# Patient Record
Sex: Female | Born: 1980 | Race: Black or African American | Hispanic: No | Marital: Married | Smoking: Never smoker
Health system: Southern US, Community
[De-identification: ages and names within clinical notes are randomized; demographics above are authoritative.]

---

## 2011-01-06 ENCOUNTER — Emergency Department (HOSPITAL_COMMUNITY)
Admission: EM | Admit: 2011-01-06 | Discharge: 2011-01-06 | Disposition: A | Discharge status: Home / Self Care | Payer: Self-pay | Attending: Emergency Medicine | Admitting: Emergency Medicine

## 2011-01-06 ENCOUNTER — Emergency Department (HOSPITAL_COMMUNITY): Payer: Self-pay

## 2011-01-06 DIAGNOSIS — S025XXA Fracture of tooth (traumatic), initial encounter for closed fracture: Secondary | ICD-10-CM | POA: Insufficient documentation

## 2011-01-06 DIAGNOSIS — R51 Headache: Secondary | ICD-10-CM | POA: Insufficient documentation

## 2014-10-04 ENCOUNTER — Encounter (HOSPITAL_COMMUNITY): Payer: Self-pay | Admitting: Nurse Practitioner

## 2014-10-04 ENCOUNTER — Emergency Department (HOSPITAL_COMMUNITY): Payer: PRIVATE HEALTH INSURANCE

## 2014-10-04 ENCOUNTER — Emergency Department (HOSPITAL_COMMUNITY)
Admission: EM | Admit: 2014-10-04 | Discharge: 2014-10-04 | Disposition: A | Discharge status: Home / Self Care | Payer: PRIVATE HEALTH INSURANCE | Attending: Emergency Medicine | Admitting: Emergency Medicine

## 2014-10-04 DIAGNOSIS — Z87828 Personal history of other (healed) physical injury and trauma: Secondary | ICD-10-CM | POA: Diagnosis not present

## 2014-10-04 DIAGNOSIS — D492 Neoplasm of unspecified behavior of bone, soft tissue, and skin: Secondary | ICD-10-CM | POA: Insufficient documentation

## 2014-10-04 DIAGNOSIS — Z86718 Personal history of other venous thrombosis and embolism: Secondary | ICD-10-CM | POA: Diagnosis not present

## 2014-10-04 DIAGNOSIS — M25561 Pain in right knee: Secondary | ICD-10-CM | POA: Diagnosis present

## 2014-10-04 DIAGNOSIS — Z86711 Personal history of pulmonary embolism: Secondary | ICD-10-CM | POA: Insufficient documentation

## 2014-10-04 MED ORDER — IBUPROFEN 800 MG PO TABS: 800.0000 mg | ORAL_TABLET | Freq: Three times a day (TID) | ORAL | Status: DC

## 2014-10-04 NOTE — ED Provider Notes (Signed)
CSN: [id number]     Arrival date & time 10/04/14  1205 History  This chart was scribed for non-physician practitioner, Brent General, PA-C working with Leonard Schwartz, MD by Tula Nakayama, ED scribe. This patient was seen in room TR06C/TR06C and the patient's care was started at 1:04 PM   Chief Complaint  Patient presents with  . Knee Pain   The history is provided by the patient. No language interpreter was used.    HPI Comments: Julie Kelley is a 34 y.o. female who presents to the Emergency Department complaining of constant, moderate right knee pain that started 2 weeks ago. She reports a knot on the inferior aspect of her knee as an associated symptom. Pt's pain becomes worse with palpation and bearing weight, but does not increase with movement. She has tried Ibuprofen with no relief. Pt reports that she was previously evaluated by [facility] ED for the same last week who did not provide an explanation for her pain, but referred her to an orthopedist. She has a follow-up with the orthopedist in 4 days. Pt denies recent long travel, a history of PE/DVT and recent injuries or falls. She also denies numbness and weakness as an associated symptom.   History reviewed. No pertinent past medical history. History reviewed. No pertinent past surgical history. History reviewed. No pertinent family history. History  Substance Use Topics  . Smoking status: Never Smoker   . Smokeless tobacco: Not on file  . Alcohol Use: No   OB History    No data available     Review of Systems  Musculoskeletal: Positive for arthralgias.  Skin: Negative for wound.  Neurological: Negative for weakness and numbness.    Allergies  Review of patient's allergies indicates no known allergies.  Home Medications   Prior to Admission medications   Medication Sig Start Date End Date Taking? Authorizing Provider  ibuprofen (ADVIL,MOTRIN) 800 MG tablet Take 1 tablet (800 mg total) by mouth 3 (three) times daily.  10/04/14   Dahlia Bailiff, PA-C   BP 100/70 mmHg  Pulse 74  Temp(Src) 97.8 F (36.6 C) (Oral)  Resp 16  SpO2 100%  LMP 09/19/2014 Physical Exam  Constitutional: She appears well-developed and well-nourished. No distress.  HENT:  Head: Normocephalic and atraumatic.  Eyes: Conjunctivae and EOM are normal.  Neck: Neck supple. No tracheal deviation present.  Cardiovascular: Normal rate.   Pulmonary/Chest: Effort normal. No respiratory distress.  Musculoskeletal: Normal range of motion.  3 x 3 cm raised area on the lateral joint line of her knee; non-erythematous; no obvious effusion; full active and passive ROM; no anterior or posterior, medial or lateral instability; DP pulse 2+  Skin: Skin is warm and dry.  Psychiatric: She has a normal mood and affect. Her behavior is normal.  Nursing note and vitals reviewed.   ED Course  Procedures   DIAGNOSTIC STUDIES: Oxygen Saturation is 99% on RA, normal by my interpretation.    COORDINATION OF CARE: 1:08 PM Discussed x-ray results with pt. Discussed treatment plan with pt which includes US of the affected area at bedside, knee sleeve and pain management. Pt agreed to plan.  1:46 PM US showed minimal fluid. Recommended pt to follow-up with orthopedist. Advised pt to reurn with onset of fever or increased warmth. Pt agreed to plan.  Labs Review Labs Reviewed - No data to display  Imaging Review Dg Knee Complete 4 Views Right  10/04/2014   CLINICAL DATA:  Right anterior lateral knee pain and focal  swelling, increased over the past week. No reported injury.  EXAM: RIGHT KNEE - COMPLETE 4+ VIEW  COMPARISON:  10/03/2014.  FINDINGS: Mild medial and patellofemoral spur formation. No fracture, effusion or visible mass.  IMPRESSION: Mild degenerative changes.   Electronically Signed   By: Claudie Revering M.D.   On: 10/04/2014 12:37     EKG Interpretation None      MDM   Final diagnoses:  Soft tissue tumor    Patient here with atypical,  superficial soft tissue lesion. This lesion is well-appearing, mildly tender to palpation. It does not have any signs of infection. Patient's radiographs are unremarkable for acute pathology. Lesion was assessed with ultrasound, findings were nonspecific. No concern for DVT on exam or by patient's history. Wells criteria 0 for DVT. Patient afebrile, well-appearing, hemodynamically stable and in no acute distress. No concern for gout or septic arthritis. Patient stable for discharge, strongly instructed to follow up with orthopedics, patient states she has appointment with Ortho in approximately 5 days. Return precautions discussed, patient verbalizes understanding and agreement of this plan.  I personally performed the services described in this documentation, which was scribed in my presence. The recorded information has been reviewed and is accurate.  BP 100/70 mmHg  Pulse 74  Temp(Src) 97.8 F (36.6 C) (Oral)  Resp 16  SpO2 100%  LMP 09/19/2014  Signed,  Dahlia Bailiff, PA-C 5:26 PM    Dahlia Bailiff, PA-C 10/04/14 1726  Leonard Schwartz, MD 10/05/14 [phone]

## 2014-10-04 NOTE — ED Notes (Signed)
She reports R knee pain x 2 weeks. Denies any injuries. She had begun to feel a "knot" in the the knee for past few days and the pain is increasing. She took ibuprofen with no relief. Ambulatory, cms intact.

## 2014-10-04 NOTE — Discharge Instructions (Signed)
Lipoma A lipoma is a noncancerous (benign) tumor composed of fat cells. They are usually found under the skin (subcutaneous). A lipoma may occur in any tissue of the body that contains fat. Common areas for lipomas to appear include the back, shoulders, buttocks, and thighs. Lipomas are a very common soft tissue growth. They are soft and grow slowly. Most problems caused by a lipoma depend on where it is growing. DIAGNOSIS  A lipoma can be diagnosed with a physical exam. These tumors rarely become cancerous, but radiographic studies can help determine this for certain. Studies used may include:  Computerized X-ray scans (CT or CAT scan).  Computerized magnetic scans (MRI). TREATMENT  Small lipomas that are not causing problems may be watched. If a lipoma continues to enlarge or causes problems, removal is often the best treatment. Lipomas can also be removed to improve appearance. Surgery is done to remove the fatty cells and the surrounding capsule. Most often, this is done with medicine that numbs the area (local anesthetic). The removed tissue is examined under a microscope to make sure it is not cancerous. Keep all follow-up appointments with your caregiver. SEEK MEDICAL CARE IF:   The lipoma becomes larger or hard.  The lipoma becomes painful, red, or increasingly swollen. These could be signs of infection or a more serious condition. Document Released: 02/13/2002 Document Revised: 05/18/2011 Document Reviewed: 07/26/2009 [facility] Patient Information 2015 [place], [place]. This information is not intended to replace advice given to you by your health care provider. Make sure you discuss any questions you have with your health care provider.   Emergency Department Resource Guide 1) Find a Doctor and Pay Out of Pocket Although you won't have to find out who is covered by your insurance plan, it is a good idea to ask around and get recommendations. You will then need to call the office and see  if the doctor you have chosen will accept you as a new patient and what types of options they offer for patients who are self-pay. Some doctors offer discounts or will set up payment plans for their patients who do not have insurance, but you will need to ask so you aren't surprised when you get to your appointment.  2) Contact Your Local Health Department Not all health departments have doctors that can see patients for sick visits, but many do, so it is worth a call to see if yours does. If you don't know where your local health department is, you can check in your phone book. The CDC also has a tool to help you locate your state's health department, and many state websites also have listings of all of their local health departments.  3) Find a [facility] If your illness is not likely to be very severe or complicated, you may want to try a walk in clinic. These are popping up all over the country in pharmacies, drugstores, and shopping centers. They're usually staffed by nurse practitioners or physician assistants that have been trained to treat common illnesses and complaints. They're usually fairly quick and inexpensive. However, if you have serious medical issues or chronic medical problems, these are probably not your best option.  No Primary Care Doctor: - Call Health Connect at  [phone] - they can help you locate a primary care doctor that  accepts your insurance, provides certain services, etc. - Physician Referral Service- [phone]  Chronic Pain Problems: Organization         Address  Phone   Notes  [facility]  [phone] Patients need to be referred by their primary care doctor.   Medication Assistance: Organization         Address  Phone   Notes  [facility] Medication [facility] [address] [place], [place] [address] [phone] --Must be a resident of [facility] -- Must have NO insurance coverage whatsoever (no  Medicaid/ Medicare, etc.) -- The pt. MUST have a primary care doctor that directs their care regularly and follows them in the community   MedAssist  [phone]   [employer]  [phone]    Agencies that provide inexpensive medical care: Organization         Address  Phone   Notes  Wells  [phone]   [facility]    [phone]   [facility] [place], [place] [address] [phone]   [place] [address], [place] [phone]   Planned Parenthood    [phone]   [facility]    [phone]   [place] and [address], [place] Phone:  [phone], Fax:  [phone] Hours of Operation:  9 am - 6 pm, M-F.  Also accepts Medicaid/Medicare and self-pay.  [facility] for [place] [address], [address], [place] Phone: [phone], Fax: [phone]. Hours of Operation:  8:30 am - 5:30 pm, M-F.  Also accepts Medicaid and self-pay.  [facility] [place] [address] [place] Phone: [phone]   [place], [place], [place] [phone], Ext. [phone] Mondays & Thursdays: 7-9 AM.  First 15 patients are seen on a first come, first serve basis.    [place] Providers:  Organization         Address  Phone   Notes  [facility] [address],  [phone] Also accepts self-pay patients.  [facility] [address], [place]  [phone]   [place], [address], [place] [phone]   [facility] [address] [place] [phone]   Lucianne Lei [address], [place]   [phone] Only accepts [place] Access [place] patients after they have their name applied to their card.    Self-Pay (no insurance) in [facility]:  Organization         Address  Phone   Notes  Sickle Cell Patients, [facility] Internal Medicine [place] [phone]   [facility] Urgent Care [place] [phone]   [facility] [place]  [place], [place], [place] [phone]   [facility]/Dr. Osei-Bonsu  [address], [place] or [address], [place] [phone] Phone number for both [place] and [place] locations is the same.  Urgent Medical and [facility] [address], [place] [phone]   [facility] [address] [place] or [address] [phone]   [place] [facility] [address], [place] [phone], phone; [phone], fax Sees patients 1st and 3rd Saturday of every month.  Must not qualify for public or private insurance (i.e. Medicaid,  Medicare, [place] Health Choice, Veterans' Benefits)  Household income should be no more than 200% of the poverty level The clinic cannot treat you if you are pregnant or think you are pregnant  Sexually transmitted diseases are not treated at the clinic.    Dental Care: Organization         Address  Phone  Notes  [facility] Department of [facility] [place] [phone] Accepts children up to age 91 who are enrolled in [place] or [place]; pregnant women with a Medicaid card; and children who have applied for Medicaid or [place] Health Choice, but were declined, whose parents can pay a reduced fee at time of service.  [facility] Department of [facility]  [address], [place] [phone] Accepts children up to age 23 who are enrolled in [place] or [place]; pregnant women with a Medicaid card; and children who have applied for Medicaid or [place] Health Choice,  but were declined, whose parents can pay a reduced fee at time of service.  [place] Adult Dental Access PROGRAM  [place] [phone] Patients are seen by appointment only. Walk-ins are not accepted. [place] will see patients 44 years of age and older. Monday - Tuesday (8am-5pm) Most Wednesdays (8:30-5pm) $30 per visit, cash only  [facility] Adult Dental Access PROGRAM  [address], [facility] [phone] Patients are seen by appointment only. Walk-ins are not accepted. [place] will see patients 74 years of age and older. One Wednesday Evening (Monthly: Volunteer Based).  $30 per visit, cash only  [place]  [phone] for adults; Children under age 66, call Graduate Pediatric Dentistry at [phone]. Children aged 28-14, please call [phone] to request a pediatric application.  Dental services are provided in all areas of dental care including fillings, crowns and bridges, complete and partial dentures, implants, gum treatment, root canals, and extractions. Preventive care is also provided. Treatment is provided to both adults and children. Patients are selected via a lottery and there is often a waiting list.   [facility] [address], [place]  [phone] [email or web address]   [facility] [address] [place], [place] [phone], Ext. [phone] Second and Fourth Thursday of each month, opens at 6:30 AM; Clinic ends at 9 AM.  Patients are seen on a first-come first-served basis, and a limited number are seen during each clinic.   [facility]  [address] Hillard Danker [place], [place] [phone]   Eligibility Requirements You must have lived in [place], [place], or [place] counties for at least the last three months.   You cannot be eligible for state or federal sponsored [employer], including [employer], [place], or [employer].   You generally  cannot be eligible for healthcare insurance through your employer.    How to apply: Eligibility screenings are held every Tuesday and Wednesday afternoon from 1:00 pm until 4:00 pm. You do not need an appointment for the interview!  [facility] [address] [place], [place]   [place]  [place] Department  [place]  [phone]    Behavioral Health Resources in the Community: Intensive Outpatient Programs Organization         Address  Phone  Notes  [place]  Services [place] [address] [place], [place] [phone]   [facility] Outpatient [address], [place], [place]   ADS: Alcohol & Drug Svcs [address] [place], [place]   [place] [address],  [place], [place] or [phone]   Substance Abuse Resources Organization         Address  Phone  Notes  Alcohol and Drug Services  [phone]   [place]  [phone]   The [place]   Chinita Pester  [phone]   Residential & Outpatient Substance Abuse Program  [phone]   Psychological Services Organization         Address  Phone  Notes  [facility] [place]  [place]  [phone]   [place] [address], [place] or [phone]    Mobile Crisis Teams Organization         Address  Phone  Notes  [facility]atives, Mobile Crisis Care Unit  [phone]   Assertive Psychotherapeutic Services  [address] [place], [place]   [place] [address], [place] [phone]    Self-Help/Support Groups Organization         Address  Phone             Notes  [place]. of [place] - variety of support groups  [place] Call for more  information  Narcotics Anonymous (NA), [facility] [address], [employer] [place]  2 meetings at this location   Special educational needs teacher         Address  Phone  Notes  ASAP Residential Treatment [place],    [place]  [phone]   [facility]  [address] [place] [address]37902, [place], [place]   [place], [place] [phone] Admissions: 8am-3pm M-F  Incentives Substance [place] [address],    [place], [place] [phone]   [facility] [address] [place], [place], [place]   [facility] [address]  [place], [place]   [facility]ight Programs - Intensive Outpatient [address], [place], [place]   [facility] ([place] [address].,  [place], [place] [phone] or [phone]   [facility]idential Treatment Services (RTS) [address], [place], [place] Accepts Medicaid  [facility] [place] [address]  [place] [phone] Substance Abuse/Addiction Treatment   [facility] Organization         Address  Phone  Notes  [facility]  [phone]   Domenic Schwab, PhD [address] Arlis [place], [place]   [phone] or [phone]   [place], [place] [phone]   [facility] [address], [place], [place] [phone] Insurance/Medicaid/sponsorship through [facility] and Families [address] [place]                                    [place], [place] [phone] [place] [address], [place] [phone]    Dr. Adele Schilder  [phone]   [facility] [place] Dept. 1) [address] [place] 2) [place] 3)  [place], [place] [phone]  [phone]   [place] [phone] or [phone] (After Hours)

## 2014-10-19 DIAGNOSIS — M7120 Synovial cyst of popliteal space [Baker], unspecified knee: Secondary | ICD-10-CM | POA: Insufficient documentation

## 2014-10-19 DIAGNOSIS — M67469 Ganglion, unspecified knee: Secondary | ICD-10-CM | POA: Insufficient documentation

## 2014-10-19 DIAGNOSIS — S83289A Other tear of lateral meniscus, current injury, unspecified knee, initial encounter: Secondary | ICD-10-CM | POA: Insufficient documentation

## 2014-10-26 DIAGNOSIS — M25561 Pain in right knee: Secondary | ICD-10-CM | POA: Insufficient documentation

## 2014-11-14 DIAGNOSIS — M9420 Chondromalacia, unspecified site: Secondary | ICD-10-CM | POA: Insufficient documentation

## 2015-04-04 DIAGNOSIS — M722 Plantar fascial fibromatosis: Secondary | ICD-10-CM | POA: Insufficient documentation

## 2015-08-21 ENCOUNTER — Emergency Department (HOSPITAL_COMMUNITY): Payer: PRIVATE HEALTH INSURANCE

## 2015-08-21 ENCOUNTER — Emergency Department (HOSPITAL_COMMUNITY)
Admission: EM | Admit: 2015-08-21 | Discharge: 2015-08-21 | Disposition: A | Discharge status: Home / Self Care | Payer: PRIVATE HEALTH INSURANCE | Attending: Emergency Medicine | Admitting: Emergency Medicine

## 2015-08-21 ENCOUNTER — Encounter (HOSPITAL_COMMUNITY): Payer: Self-pay | Admitting: Emergency Medicine

## 2015-08-21 DIAGNOSIS — Y92321 Football field as the place of occurrence of the external cause: Secondary | ICD-10-CM | POA: Diagnosis not present

## 2015-08-21 DIAGNOSIS — W500XXA Accidental hit or strike by another person, initial encounter: Secondary | ICD-10-CM | POA: Diagnosis not present

## 2015-08-21 DIAGNOSIS — Y9361 Activity, american tackle football: Secondary | ICD-10-CM | POA: Insufficient documentation

## 2015-08-21 DIAGNOSIS — M25572 Pain in left ankle and joints of left foot: Secondary | ICD-10-CM

## 2015-08-21 DIAGNOSIS — Y999 Unspecified external cause status: Secondary | ICD-10-CM | POA: Diagnosis not present

## 2015-08-21 DIAGNOSIS — M79672 Pain in left foot: Secondary | ICD-10-CM | POA: Diagnosis present

## 2015-08-21 MED ORDER — NAPROXEN 500 MG PO TABS: 500.0000 mg | ORAL_TABLET | Freq: Two times a day (BID) | ORAL | Status: DC

## 2015-08-21 NOTE — ED Notes (Signed)
Patient transported to X-ray 

## 2015-08-21 NOTE — ED Provider Notes (Signed)
CSN: [id number]     Arrival date & time 08/21/15  1825 History  By signing my name below, I, Soijett Blue, attest that this documentation has been prepared under the direction and in the presence of Donnald Garre, PA-C Electronically Signed: [place], ED Scribe. 08/21/2015. 8:02 PM.   Chief Complaint  Patient presents with  . Foot Pain      The history is provided by the patient. No language interpreter was used.    Julie Kelley is a 35 y.o. female who presents to the Emergency Department complaining of left foot pain onset yesterday. Pt reports that her 75 year old son fell onto her left foot while he was playing football. Pt notes that she has been able to ambulate since the incident. Pt is having associated symptoms of left foot swelling. She notes that she has tried tylenol with mild relief of her symptoms. She denies color change, wound, rash, gait problem, and any other symptoms.    History reviewed. No pertinent past medical history. History reviewed. No pertinent past surgical history. No family history on file. Social History  Substance Use Topics  . Smoking status: Never Smoker   . Smokeless tobacco: None  . Alcohol Use: No   OB History    No data available     Review of Systems  Musculoskeletal: Positive for joint swelling (left foot) and arthralgias (left foot). Negative for gait problem.  Skin: Negative for color change, rash and wound.  All other systems reviewed and are negative.    Allergies  Review of patient's allergies indicates no known allergies.  Home Medications   Prior to Admission medications   Medication Sig Start Date End Date Taking? Authorizing Provider  ibuprofen (ADVIL,MOTRIN) 800 MG tablet Take 1 tablet (800 mg total) by mouth 3 (three) times daily. 10/04/14   Dahlia Bailiff, PA-C   BP 135/89 mmHg  Pulse 84  Temp(Src) 98.5 F (36.9 C) (Oral)  Resp 18  SpO2 100%  LMP 08/01/2015 (Exact Date) Physical Exam  Constitutional: She is  oriented to person, place, and time. She appears well-developed and well-nourished. No distress.  HENT:  Head: Normocephalic and atraumatic.  Eyes: Conjunctivae are normal. Right eye exhibits no discharge. Left eye exhibits no discharge. No scleral icterus.  Cardiovascular: Normal rate.   Pulmonary/Chest: Effort normal.  Musculoskeletal:       Left ankle: She exhibits no ecchymosis and no deformity. Tenderness. Lateral malleolus tenderness found.       Left foot: There is tenderness. There is normal range of motion, no swelling and no deformity.  Mild TTP over dorsum of left foot and lateral malleolus. Mild pain with dorsi flexion but no decreased ROM. No edema, ecchymosis, or erythema. No obvious bony deformity. Intact distal pulses.   Neurological: She is alert and oriented to person, place, and time. Coordination normal.  Skin: Skin is warm and dry. No rash noted. She is not diaphoretic. No erythema. No pallor.  Psychiatric: She has a normal mood and affect. Her behavior is normal.  Nursing note and vitals reviewed.   ED Course  Procedures (including critical care time) DIAGNOSTIC STUDIES: Oxygen Saturation is 100% on RA, nl by my interpretation.    COORDINATION OF CARE: 8:01 PM Discussed treatment plan with pt at bedside which includes left foot xray and naprosyn Rx and pt agreed to plan.    Labs Review Labs Reviewed - No data to display  Imaging Review Dg Foot Complete Left  08/21/2015  CLINICAL DATA:  Injury to the left foot with pain and swelling on the lateral side near the fifth metatarsal base. EXAM: LEFT FOOT - COMPLETE 3+ VIEW COMPARISON:  04/04/2015 FINDINGS: Negative for a fracture or dislocation. Alignment of the foot is normal. Again noted is a prominent spur along the plantar aspect of the calcaneus. Soft tissues are unremarkable. IMPRESSION: No acute bone abnormality. Calcaneal spur. Electronically Signed   By: Markus Daft M.D.   On: 08/21/2015 19:43   I have  personally reviewed and evaluated these images as part of my medical decision-making.   EKG Interpretation None      MDM   Final diagnoses:  Ankle pain, left   Patient X-Ray negative for obvious fracture or dislocation.  Pt advised to follow up with orthopedics. Patient given ace wrap while in ED and will be discharged home with naprosyn. Conservative therapy recommended and discussed. Patient will be discharged home & is agreeable with above plan. Returns precautions discussed. Pt appears safe for discharge.   I personally performed the services described in this documentation, which was scribed in my presence. The recorded information has been reviewed and is accurate.     Dondra Spry [place], PA-C 08/21/15 2313  Davonna Belling, MD 08/22/15 [phone]

## 2015-08-21 NOTE — Discharge Instructions (Signed)
Ankle Pain Ankle pain is a common symptom. The bones, cartilage, tendons, and muscles of the ankle joint perform a lot of work each day. The ankle joint holds your body weight and allows you to move around. Ankle pain can occur on either side or back of 1 or both ankles. Ankle pain may be sharp and burning or dull and aching. There may be tenderness, stiffness, redness, or warmth around the ankle. The pain occurs more often when a person walks or puts pressure on the ankle. CAUSES  There are many reasons ankle pain can develop. It is important to work with your caregiver to identify the cause since many conditions can impact the bones, cartilage, muscles, and tendons. Causes for ankle pain include:  Injury, including a break (fracture), sprain, or strain often due to a fall, sports, or a high-impact activity.  Swelling (inflammation) of a tendon (tendonitis).  Achilles tendon rupture.  Ankle instability after repeated sprains and strains.  Poor foot alignment.  Pressure on a nerve (tarsal tunnel syndrome).  Arthritis in the ankle or the lining of the ankle.  Crystal formation in the ankle (gout or pseudogout). DIAGNOSIS  A diagnosis is based on your medical history, your symptoms, results of your physical exam, and results of diagnostic tests. Diagnostic tests may include X-ray exams or a computerized magnetic scan (magnetic resonance imaging, MRI). TREATMENT  Treatment will depend on the cause of your ankle pain and may include:  Keeping pressure off the ankle and limiting activities.  Using crutches or other walking support (a cane or brace).  Using rest, ice, compression, and elevation.  Participating in physical therapy or home exercises.  Wearing shoe inserts or special shoes.  Losing weight.  Taking medications to reduce pain or swelling or receiving an injection.  Undergoing surgery. HOME CARE INSTRUCTIONS   Only take over-the-counter or prescription medicines for  pain, discomfort, or fever as directed by your caregiver.  Put ice on the injured area.  Put ice in a plastic bag.  Place a towel between your skin and the bag.  Leave the ice on for 15-20 minutes at a time, 03-04 times a day.  Keep your leg raised (elevated) when possible to lessen swelling.  Avoid activities that cause ankle pain.  Follow specific exercises as directed by your caregiver.  Record how often you have ankle pain, the location of the pain, and what it feels like. This information may be helpful to you and your caregiver.  Ask your caregiver about returning to work or sports and whether you should drive.  Follow up with your caregiver for further examination, therapy, or testing as directed. SEEK MEDICAL CARE IF:   Pain or swelling continues or worsens beyond 1 week.  You have an oral temperature above 102 F (38.9 C).  You are feeling unwell or have chills.  You are having an increasingly difficult time with walking.  You have loss of sensation or other new symptoms.  You have questions or concerns. MAKE SURE YOU:   Understand these instructions.  Will watch your condition.  Will get help right away if you are not doing well or get worse.   This information is not intended to replace advice given to you by your health care provider. Make sure you discuss any questions you have with your health care provider.   Document Released: 08/13/2009 Document Revised: 05/18/2011 Document Reviewed: 09/25/2014 Elsevier Interactive Patient Education 2016 [employer]  Cryotherapy Cryotherapy is when you put ice on  your injury. Ice helps lessen pain and puffiness (swelling) after an injury. Ice works the best when you start using it in the first 24 to 48 hours after an injury. HOME CARE  Put a dry or damp towel between the ice pack and your skin.  You may press gently on the ice pack.  Leave the ice on for no more than 10 to 20 minutes at a time.  Check your  skin after 5 minutes to make sure your skin is okay.  Rest at least 20 minutes between ice pack uses.  Stop using ice when your skin loses feeling (numbness).  Do not use ice on someone who cannot tell you when it hurts. This includes small children and people with memory problems (dementia). GET HELP RIGHT AWAY IF:  You have white spots on your skin.  Your skin turns blue or pale.  Your skin feels waxy or hard.  Your puffiness gets worse. MAKE SURE YOU:   Understand these instructions.  Will watch your condition.  Will get help right away if you are not doing well or get worse.   This information is not intended to replace advice given to you by your health care provider. Make sure you discuss any questions you have with your health care provider.   Apply ice to affected area. Take Naprosyn as needed for pain. Keep ankle wrapped as much as possible and elevate at night. Follow up with your primary care doctor if her symptoms do not improve. Return to the emergency department if you experience increased swelling of your ankle, redness or warmth to touch, pain in your calf, chest pain, shortness of breath, fevers or chills.

## 2015-08-21 NOTE — ED Notes (Signed)
PA at bedside.

## 2015-08-21 NOTE — ED Notes (Signed)
Family member fell onto pt's L foot yesterday.  C/o L foot pain and swelling.

## 2018-01-15 IMAGING — DX DG FOOT COMPLETE 3+V*L*
3 series · 3 of 3 positions shown · non-contrast
Comparison: 04/04/2015

CLINICAL DATA: Injury to the left foot with pain and swelling on
the lateral side near the fifth metatarsal base.

EXAM:
LEFT FOOT - COMPLETE 3+ VIEW

[x foot ap left]
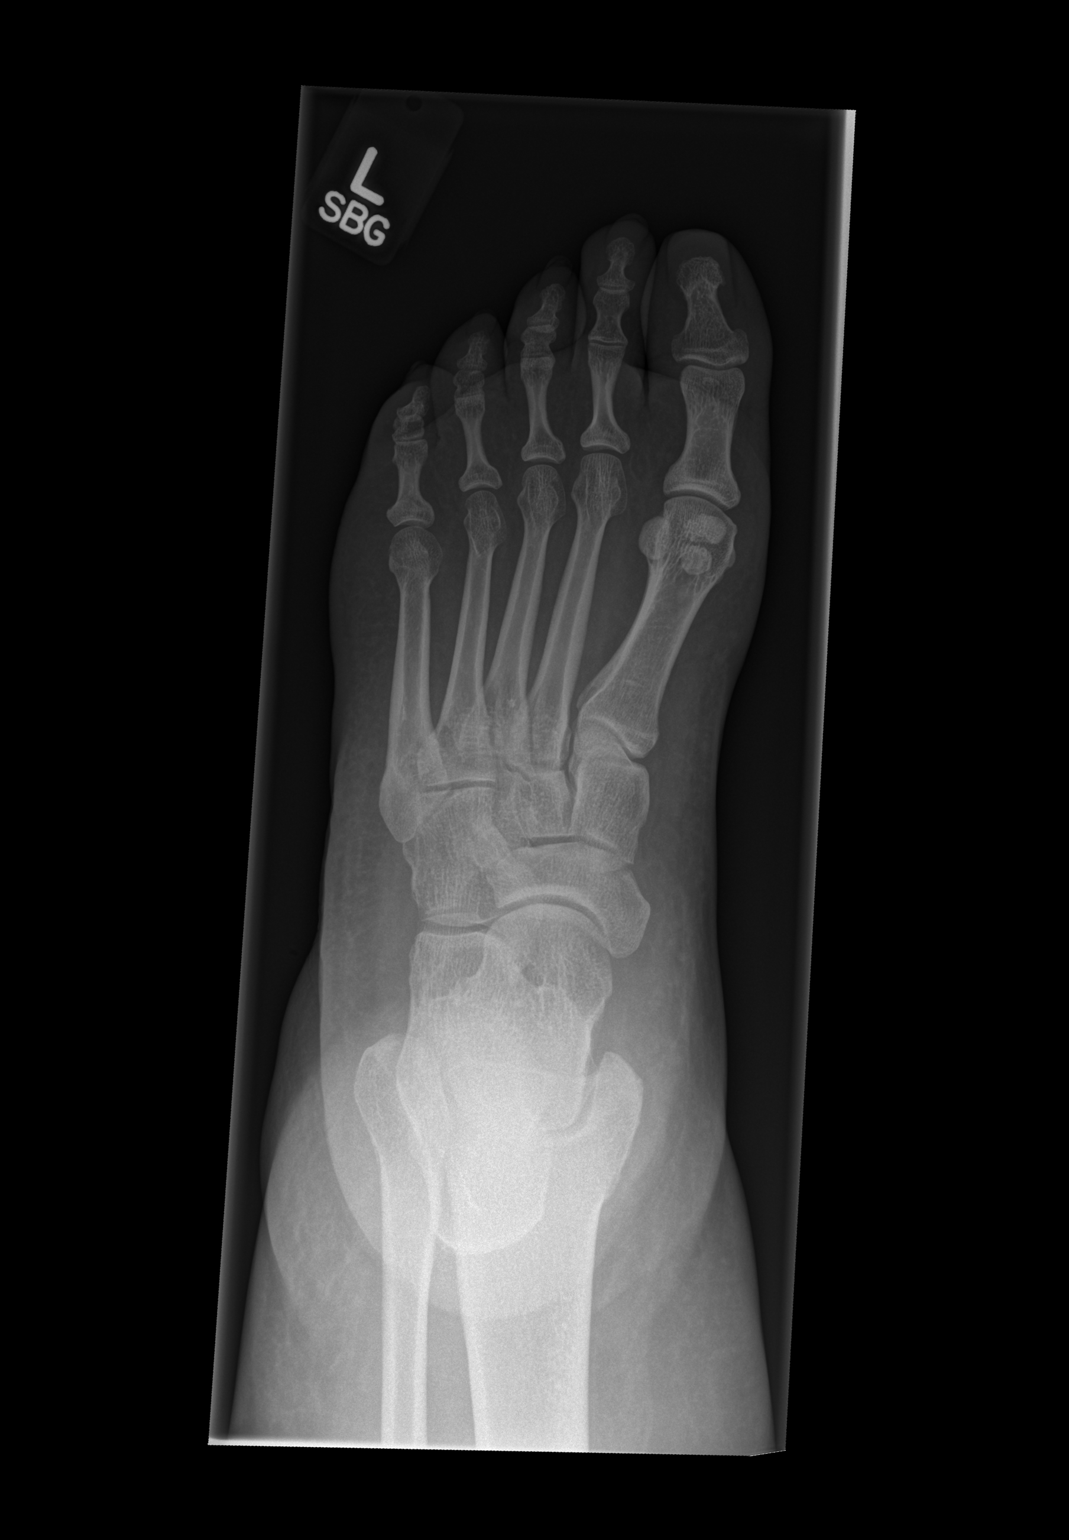

[x foot obl left]
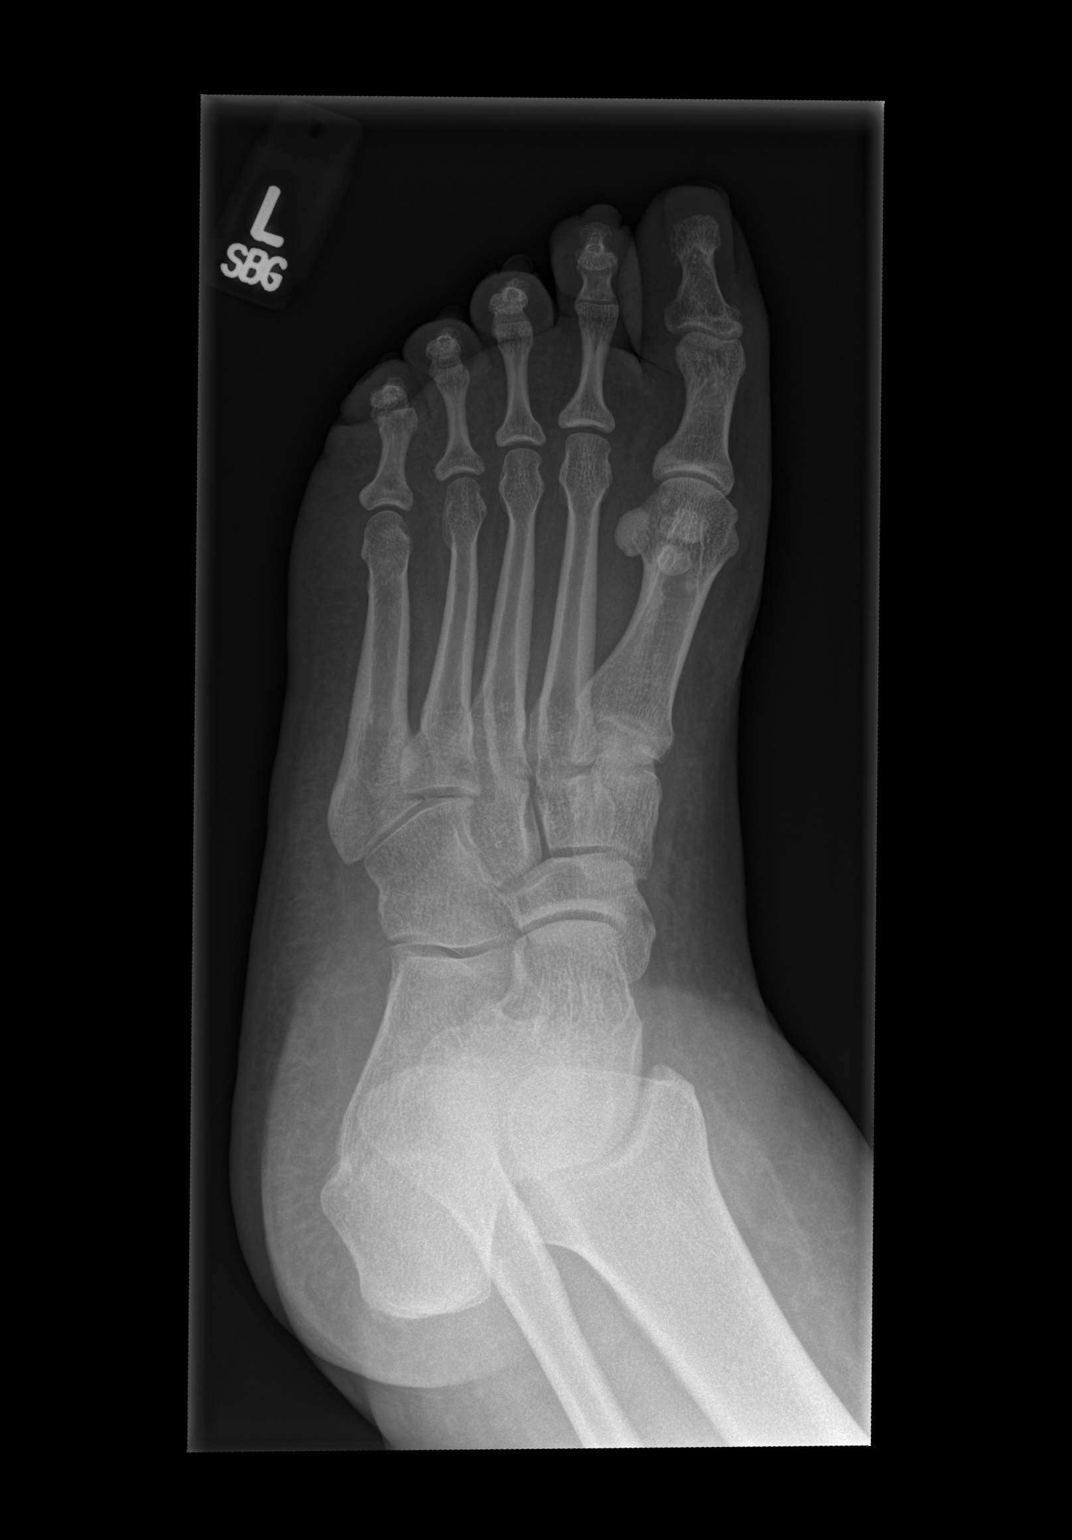

[x foot lat left]
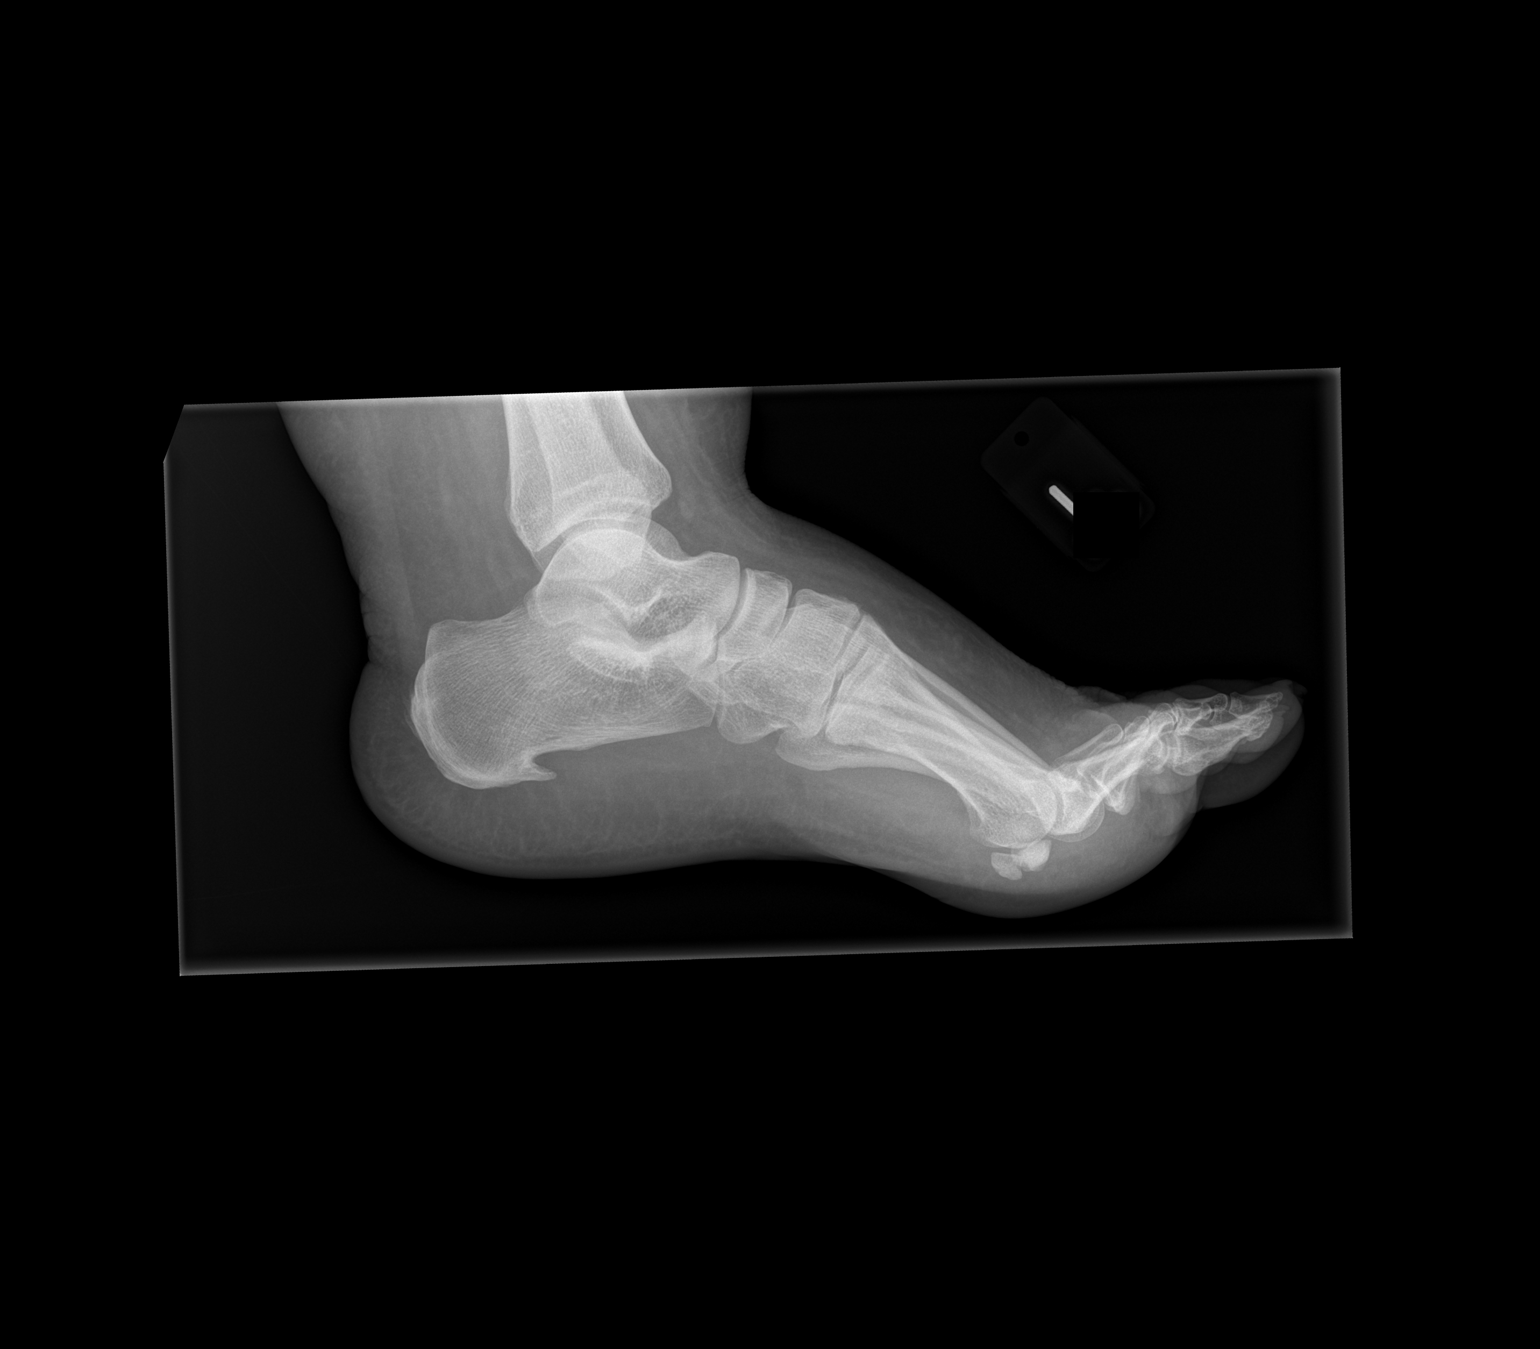

[3 of 3 positions shown; findings below may reference images not displayed]

FINDINGS: Negative for a fracture or dislocation. Alignment of the foot is
normal. Again noted is a prominent spur along the plantar aspect of
the calcaneus. Soft tissues are unremarkable.
IMPRESSION: No acute bone abnormality.

Calcaneal spur.

## 2018-07-13 ENCOUNTER — Ambulatory Visit: Payer: PRIVATE HEALTH INSURANCE

## 2018-07-14 ENCOUNTER — Encounter: Payer: PRIVATE HEALTH INSURANCE | Admitting: Family Medicine

## 2018-07-14 DIAGNOSIS — N898 Other specified noninflammatory disorders of vagina: Secondary | ICD-10-CM

## 2019-03-13 DIAGNOSIS — Z202 Contact with and (suspected) exposure to infections with a predominantly sexual mode of transmission: Secondary | ICD-10-CM | POA: Insufficient documentation

## 2019-03-13 DIAGNOSIS — N920 Excessive and frequent menstruation with regular cycle: Secondary | ICD-10-CM | POA: Insufficient documentation

## 2019-03-29 DIAGNOSIS — M1812 Unilateral primary osteoarthritis of first carpometacarpal joint, left hand: Secondary | ICD-10-CM | POA: Insufficient documentation

## 2019-04-12 DIAGNOSIS — J4540 Moderate persistent asthma, uncomplicated: Secondary | ICD-10-CM | POA: Insufficient documentation

## 2020-02-15 DIAGNOSIS — J329 Chronic sinusitis, unspecified: Secondary | ICD-10-CM | POA: Insufficient documentation

## 2021-07-16 DIAGNOSIS — B9689 Other specified bacterial agents as the cause of diseases classified elsewhere: Secondary | ICD-10-CM | POA: Insufficient documentation

## 2021-07-16 DIAGNOSIS — N898 Other specified noninflammatory disorders of vagina: Secondary | ICD-10-CM | POA: Insufficient documentation

## 2023-04-27 ENCOUNTER — Encounter: Payer: PRIVATE HEALTH INSURANCE | Admitting: Obstetrics and Gynecology

## 2023-04-27 ENCOUNTER — Other Ambulatory Visit: Payer: Self-pay

## 2023-06-23 NOTE — Progress Notes (Deleted)
   ANNUAL EXAM Patient name: Julie Kelley MRN [id number]  Date of birth: December 17, 1980 Chief Complaint:   No chief complaint on file.  History of Present Illness:   Julie Kelley is a 43 y.o. No obstetric history on file. being seen today for a routine annual exam.  Current complaints: ***  Menstrual concerns? {yes/no:20286}  *** Breast or nipple changes? {yes/no:20286} *** Contraception use? {yes/no:20286} *** Sexually active? {yes/no:20286} ***  No LMP recorded.   The pregnancy intention screening data noted above was reviewed. Potential methods of contraception were discussed. The patient elected to proceed with No data recorded.   Last pap No results found for: "DIAGPAP", "HPVHIGH", "ADEQPAP"   H/O abnormal pap: {yes/yes***/no:23866} Last mammogram: ***.  Last colonoscopy: ***.       No data to display               No data to display           Review of Systems:   Pertinent items are noted in HPI Denies any headaches, blurred vision, fatigue, shortness of breath, chest pain, abdominal pain, abnormal vaginal discharge/itching/odor/irritation, problems with periods, bowel movements, urination, or intercourse unless otherwise stated above. Pertinent History Reviewed:  Reviewed past medical,surgical, social and family history.  Reviewed problem list, medications and allergies. Physical Assessment:  There were no vitals filed for this visit.There is no height or weight on file to calculate BMI.        Physical Examination:   General appearance - well appearing, and in no distress  Mental status - alert, oriented to person, place, and time  Psych:  She has a normal mood and affect  Skin - warm and dry, normal color, no suspicious lesions noted  Chest - effort normal, all lung fields clear to auscultation bilaterally  Heart - normal rate and regular rhythm  Breasts - breasts appear normal, no suspicious masses, no skin or nipple changes or  axillary  nodes  Abdomen - soft, nontender, nondistended, no masses or organomegaly  Pelvic -  VULVA: normal appearing vulva with no masses, tenderness or lesions   VAGINA: normal appearing vagina with normal color and discharge, no lesions   CERVIX: normal appearing cervix without discharge or lesions, no CMT  Thin prep pap is {Desc; done/not:10129} *** HR HPV cotesting  UTERUS: uterus is felt to be normal size, shape, consistency and nontender   ADNEXA: No adnexal masses or tenderness noted.  Extremities:  No swelling or varicosities noted  Chaperone present for exam  No results found for this or any previous visit (from the past 24 hours).    Assessment & Plan:  There are no diagnoses linked to this encounter.      No orders of the defined types were placed in this encounter.   Meds: No orders of the defined types were placed in this encounter.   Follow-up: No follow-ups on file.  Kiki Pelton, MD 06/23/2023 12:20 PM

## 2023-06-24 ENCOUNTER — Encounter: Payer: PRIVATE HEALTH INSURANCE | Admitting: Obstetrics and Gynecology

## 2023-12-23 ENCOUNTER — Emergency Department (HOSPITAL_BASED_OUTPATIENT_CLINIC_OR_DEPARTMENT_OTHER): Payer: Self-pay

## 2023-12-23 ENCOUNTER — Observation Stay (HOSPITAL_BASED_OUTPATIENT_CLINIC_OR_DEPARTMENT_OTHER)
Admission: EM | Admit: 2023-12-23 | Discharge: 2023-12-24 | Disposition: A | Discharge status: Home / Self Care | Payer: Self-pay | Attending: Internal Medicine | Admitting: Internal Medicine

## 2023-12-23 ENCOUNTER — Other Ambulatory Visit: Payer: Self-pay

## 2023-12-23 DIAGNOSIS — Z6841 Body Mass Index (BMI) 40.0 and over, adult: Secondary | ICD-10-CM | POA: Insufficient documentation

## 2023-12-23 DIAGNOSIS — J45909 Unspecified asthma, uncomplicated: Secondary | ICD-10-CM | POA: Insufficient documentation

## 2023-12-23 DIAGNOSIS — E876 Hypokalemia: Secondary | ICD-10-CM | POA: Insufficient documentation

## 2023-12-23 DIAGNOSIS — R600 Localized edema: Secondary | ICD-10-CM

## 2023-12-23 DIAGNOSIS — N920 Excessive and frequent menstruation with regular cycle: Secondary | ICD-10-CM | POA: Insufficient documentation

## 2023-12-23 DIAGNOSIS — D649 Anemia, unspecified: Principal | ICD-10-CM | POA: Diagnosis present

## 2023-12-23 DIAGNOSIS — D509 Iron deficiency anemia, unspecified: Principal | ICD-10-CM | POA: Insufficient documentation

## 2023-12-23 LAB — CBC
HCT: 24.9 % — ABNORMAL LOW (ref 36.0–46.0)
Hemoglobin: 6.7 g/dL — CL (ref 12.0–15.0)
MCH: 18.1 pg — ABNORMAL LOW (ref 26.0–34.0)
MCHC: 26.9 g/dL — ABNORMAL LOW (ref 30.0–36.0)
MCV: 67.1 fL — ABNORMAL LOW (ref 80.0–100.0)
Platelets: 436 K/uL — ABNORMAL HIGH (ref 150–400)
RBC: 3.71 MIL/uL — ABNORMAL LOW (ref 3.87–5.11)
RDW: 22.6 % — ABNORMAL HIGH (ref 11.5–15.5)
WBC: 8.9 K/uL (ref 4.0–10.5)
nRBC: 0.2 % (ref 0.0–0.2)

## 2023-12-23 LAB — BASIC METABOLIC PANEL WITH GFR
Anion gap: 13 (ref 5–15)
BUN: 12 mg/dL (ref 6–20)
CO2: 24 mmol/L (ref 22–32)
Calcium: 9.1 mg/dL (ref 8.9–10.3)
Chloride: 101 mmol/L (ref 98–111)
Creatinine, Ser: 0.81 mg/dL (ref 0.44–1.00)
GFR, Estimated: >60 mL/min (ref >60)
Glucose, Bld: 138 mg/dL — ABNORMAL HIGH (ref 70–99)
Potassium: 3.4 mmol/L — ABNORMAL LOW (ref 3.5–5.1)
Sodium: 137 mmol/L (ref 135–145)

## 2023-12-23 LAB — URINALYSIS, ROUTINE W REFLEX MICROSCOPIC
Bilirubin Urine: NEGATIVE
Glucose, UA: NEGATIVE mg/dL
Hgb urine dipstick: NEGATIVE
Ketones, ur: NEGATIVE mg/dL
Leukocytes,Ua: NEGATIVE
Nitrite: NEGATIVE
Protein, ur: 30 mg/dL — AB
Specific Gravity, Urine: 1.02 (ref 1.005–1.030)
pH: 6 (ref 5.0–8.0)

## 2023-12-23 LAB — HEPATIC FUNCTION PANEL
ALT: 9 U/L (ref 0–44)
AST: 14 U/L — ABNORMAL LOW (ref 15–41)
Albumin: 3.8 g/dL (ref 3.5–5.0)
Alkaline Phosphatase: 82 U/L (ref 38–126)
Bilirubin, Direct: <0.1 mg/dL (ref 0.0–0.2)
Total Bilirubin: 0.2 mg/dL (ref 0.0–1.2)
Total Protein: 7.6 g/dL (ref 6.5–8.1)

## 2023-12-23 LAB — IRON AND TIBC
Iron: 16 ug/dL — ABNORMAL LOW (ref 28–170)
Saturation Ratios: 4 % — ABNORMAL LOW (ref 10.4–31.8)
TIBC: 437 ug/dL (ref 250–450)
UIBC: 421 ug/dL

## 2023-12-23 LAB — FOLATE: Folate: 7 ng/mL (ref >5.9)

## 2023-12-23 LAB — URINALYSIS, MICROSCOPIC (REFLEX)

## 2023-12-23 LAB — RETICULOCYTES
Immature Retic Fract: 35.3 % — ABNORMAL HIGH (ref 2.3–15.9)
RBC.: 3.91 MIL/uL (ref 3.87–5.11)
Retic Count, Absolute: 75.5 K/uL (ref 19.0–186.0)
Retic Ct Pct: 1.9 % (ref 0.4–3.1)

## 2023-12-23 LAB — TROPONIN T, HIGH SENSITIVITY
Troponin T High Sensitivity: <15 ng/L (ref 0–19)
Troponin T High Sensitivity: <15 ng/L (ref 0–19)

## 2023-12-23 LAB — PRO BRAIN NATRIURETIC PEPTIDE: Pro Brain Natriuretic Peptide: <50 pg/mL (ref <300.0)

## 2023-12-23 LAB — VITAMIN B12: Vitamin B-12: 766 pg/mL (ref 180–914)

## 2023-12-23 LAB — FERRITIN: Ferritin: 8 ng/mL — ABNORMAL LOW (ref 11–307)

## 2023-12-23 LAB — ABO/RH: ABO/RH(D): B POS

## 2023-12-23 LAB — PREGNANCY, URINE: Preg Test, Ur: NEGATIVE

## 2023-12-23 LAB — PREPARE RBC (CROSSMATCH)

## 2023-12-23 MED ORDER — FUROSEMIDE 10 MG/ML IJ SOLN: 40.0000 mg | Freq: Once | INTRAMUSCULAR | Status: DC

## 2023-12-23 MED ORDER — ONDANSETRON HCL 4 MG/2ML IJ SOLN: 4.0000 mg | Freq: Four times a day (QID) | INTRAMUSCULAR | Status: DC | PRN

## 2023-12-23 MED ORDER — ONDANSETRON HCL 4 MG PO TABS: 4.0000 mg | ORAL_TABLET | Freq: Four times a day (QID) | ORAL | Status: DC | PRN

## 2023-12-23 MED ORDER — ACETAMINOPHEN 650 MG RE SUPP: 650.0000 mg | Freq: Four times a day (QID) | RECTAL | Status: DC | PRN

## 2023-12-23 MED ORDER — SODIUM CHLORIDE 0.9% IV SOLUTION: Freq: Once | INTRAVENOUS | Status: AC

## 2023-12-23 MED ORDER — POTASSIUM CHLORIDE CRYS ER 20 MEQ PO TBCR
40.0000 meq | EXTENDED_RELEASE_TABLET | Freq: Once | ORAL | Status: AC
Start: 2023-12-23 — End: 2023-12-23
  Administered 2023-12-23: 40 meq via ORAL
  Filled 2023-12-23: qty 2

## 2023-12-23 MED ORDER — FUROSEMIDE 10 MG/ML IJ SOLN
40.0000 mg | Freq: Once | INTRAMUSCULAR | Status: AC
  Administered 2023-12-23: 40 mg via INTRAVENOUS
  Filled 2023-12-23: qty 4

## 2023-12-23 MED ORDER — ACETAMINOPHEN 325 MG PO TABS: 650.0000 mg | ORAL_TABLET | Freq: Four times a day (QID) | ORAL | Status: DC | PRN

## 2023-12-23 NOTE — ED Triage Notes (Addendum)
 Patient arrived POV from home with complaint of bilateral lower leg swelling x 3-4 days.   Patient also reports having the flu 1 month ago but has residual SOB since. Patient also reports PMHX of asthma and feels like it is related to current SOB.  Patient reports she used prescribed inhales without relief of SOB.  Patient ambulatory to room, no distress noted.

## 2023-12-23 NOTE — ED Notes (Signed)
 ED Provider at bedside.

## 2023-12-23 NOTE — Hospital Course (Addendum)
 Julie Kelley is a 43 y.o. female with PMH of Severe morbid obesity, asthma, menorrhagia, who presents to the ER with complaints of shortness of breath and bilateral lower extremity edema. She has been having issues with heavy period for a while, was told she has fibroid and had ablation done in march without improvement. She gets prolonged period about 9 days with heavy menstruation including clots, and she just completed her cycle. Patient otherwise denies any nausea, vomiting,fever, chills, headache, focal weakness, numbness tingling, speech difficulties.  In the ER, BNP less than 50.  Hemoglobin 6.7 from baseline of 8.5-11.5.  Tachycardic.  The patient admits to having very heavy menstruals.  Admitted to receive blood transfusion in the setting of symptomatic anemia  Pregnancy test negative UA unremarkable labs with microcytic anemia thrombocytopenia. Currently on transfer to [facility] room 1501,afebrile, VSS. Patient is admitted given 2 units of PRBC transfusion with appropriate increase in hemoglobin to 8.7 g. B12 folate normal, does have iron deficiency anemia will replace with oral iron supplementation BID and will need close follow-up w/ PCP   Subjective: Overnight afebrile BP stable hemoglobin improved improved Feels much improved following transfusion.  Discharge Diagnoses:   Symptomatic anemia Menorrhagia Iron deficiency anemia due to menorrhagia: given 2 units of PRBC transfusion with appropriate increase in hemoglobin to 8.7 g. B12 folate normal, does have iron deficiency anemia will replace with oral iron supplementation BID and will need close follow-up w/ PCP  She will need follow-up with GYN to address this issue currently no evidence of bleeding.  She is aware about the recommendation  Non pitting lower leg edema: Likely lymphedema.BNP normal but obese.  Tte - unremarkable.   Hypokalemia: Replaced  History of asthma: Stable.  Morbid Obesity w/ Body mass index is  55.79 kg/m.: Will benefit with PCP follow-up, weight loss,healthy lifestyle and outpatient sleep eval  PT Follow up Rec:    DVT prophylaxis: SCDs Start: 12/23/23 1209 Code Status:   Code Status: Full Code Family Communication: plan of care discussed with patient at bedside. Patient status is: Remains hospitalized because of severity of illness Level of care: Telemetry   Dispo: The patient is from: home            Anticipated disposition: Home today Objective: Vitals last 24 hrs: Vitals:   12/23/23 2359 12/24/23 0208 12/24/23 0523 12/24/23 1242  BP: (!) 135/96 (!) 141/72 (!) 143/57 (!) 140/94  Pulse: 93 (!) 101 99 82  Resp: 16 17 17 18   Temp: 98.7 F (37.1 C) (!) 97.3 F (36.3 C) 98.5 F (36.9 C) 98.6 F (37 C)  TempSrc: Oral Oral Oral Oral  SpO2: 100% 99% 99% 100%  Weight:      Height:        Physical Examination: General exam: alert awake, oriented, older than stated age HEENT:Oral mucosa moist, Ear/Nose WNL grossly Respiratory system: Bilaterally clear BS,no use of accessory muscle Cardiovascular system: S1 & S2 +, No JVD. Gastrointestinal system: Abdomen soft,NT,ND, BS+ Nervous System: Alert, awake, moving all extremities,and following commands. Extremities: extremities warm, leg edema nonpitting Skin: Warm, no rashes MSK: Normal muscle bulk,tone, power   Medications reviewed:  Scheduled Meds:  furosemide  40 mg Intravenous Once   Continuous Infusions: Diet: Diet Order             Diet general           Diet regular Room service appropriate? Yes; Fluid consistency: Thin  Diet effective now  DVT prophylaxis: SCDs Start: 12/23/23 1209 Code Status:   Code Status: Full Code Family Communication: plan of care discussed with patient at bedside Patient status is: Remains hospitalized because of severity of illness Level of care: Telemetry   Dispo: The patient is from: home            Anticipated disposition: TBD Objective: Vitals last  24 hrs: Vitals:   12/23/23 2359 12/24/23 0208 12/24/23 0523 12/24/23 1242  BP: (!) 135/96 (!) 141/72 (!) 143/57 (!) 140/94  Pulse: 93 (!) 101 99 82  Resp: 16 17 17 18   Temp: 98.7 F (37.1 C) (!) 97.3 F (36.3 C) 98.5 F (36.9 C) 98.6 F (37 C)  TempSrc: Oral Oral Oral Oral  SpO2: 100% 99% 99% 100%  Weight:      Height:        Physical Examination: General exam: alert awake, oriented, older than stated age HEENT:Oral mucosa moist, Ear/Nose WNL grossly Respiratory system: Bilaterally clear BS,no use of accessory muscle Cardiovascular system: S1 & S2 +, No JVD. Gastrointestinal system: Abdomen soft,NT,ND, BS+ Nervous System: Alert, awake, moving all extremities,and following commands. Extremities: extremities warm, leg edemanon pitting Skin: Warm, no rashes MSK: Normal muscle bulk,tone, power   Medications reviewed:  Scheduled Meds:  furosemide  40 mg Intravenous Once   Continuous Infusions: Diet: Diet Order             Diet general           Diet regular Room service appropriate? Yes; Fluid consistency: Thin  Diet effective now

## 2023-12-23 NOTE — H&P (Signed)
 History and Physical    Julie Kelley FMW:[id number] DOB: 1980-04-04 DOA: 12/23/2023  PCP: Glendia Jacob, NP   Patient coming from: Home Chief Complaint  Patient presents with   Asthma   HPI: Julie Kelley is a 43 y.o. female with PMH of Severe morbid obesity, asthma, menorrhagia, who presents to the ER with complaints of shortness of breath and bilateral lower extremity edema. She has been having issues with heavy period for a while, was told she has fibroid and had ablation done in march without improvement. She gets prolonged period about 9 days with heavy menstruation including clots, and she just completed her cycle. Patient otherwise denies any nausea, vomiting,fever, chills, headache, focal weakness, numbness tingling, speech difficulties.  In the ER, BNP less than 50.  Hemoglobin 6.7 from baseline of 8.5-11.5.  Tachycardic.  The patient admits to having very heavy menstruals.  Admitted to receive blood transfusion in the setting of symptomatic anemia  Pregnancy test negative UA unremarkable labs with microcytic anemia thrombocytopenia. Currently on transfer to [facility] room 1501,afebrile, VSS.  Assessment and plan:  Symptomatic anemia Menorrhagia Iron deficiency anemia due to menorrhagia: Will transfuse 2 unit PRBC with Lasix.  Check anemia panel.  She has known menorrhagia with radiology for bleeding, she will need to follow-up with her GYN upon discharge.  In the process of changing physician.  Currently completed her cycle and not bleeding,  Non pitting lower leg edema: Likely lymphedema.  BNP normal but obese.  Check echocardiogram continue Lasix for transfusion as above  Hypokalemia: Replace with potassium.  History of asthma: Stable.  Normally not on any meds  Morbid  Obesity w/ Body mass index is 55.79 kg/m.: Will benefit with PCP follow-up, weight loss,healthy lifestyle and outpatient sleep eval if not done.   DVT prophylaxis:  Code Status:   Code  Status: Not on file Family Communication: plan of care discussed with patient at bedside Patient status is: Remains hospitalized because of severity of illness Level of care: Telemetry   Dispo: The patient is from: home            Anticipated disposition: TBD Objective: Vitals last 24 hrs: Vitals:   12/23/23 0600 12/23/23 1006 12/23/23 1006 12/23/23 1015  BP: 127/78 132/83  117/77  Pulse: 87 88  86  Resp: 16 11  16   Temp:   97.8 F (36.6 C)   TempSrc:   Oral   SpO2: 94% 100%  100%  Weight:      Height:        Physical Examination: General exam: alert awake, oriented, older than stated age HEENT:Oral mucosa moist, Ear/Nose WNL grossly Respiratory system: Bilaterally clear BS,no use of accessory muscle Cardiovascular system: S1 & S2 +, No JVD. Gastrointestinal system: Abdomen soft,NT,ND, BS+ Nervous System: Alert, awake, moving all extremities,and following commands. Extremities: extremities warm, leg edemanon pitting Skin: Warm, no rashes MSK: Normal muscle bulk,tone, power   Medications reviewed:  Scheduled Meds: Continuous Infusions: Diet: Diet Order     None         Severity of Illness: The appropriate patient status for this patient is OBSERVATION. Observation status is judged to be reasonable and necessary in order to provide the required intensity of service to ensure the patient's safety. The patient's presenting symptoms, physical exam findings, and initial radiographic and laboratory data in the context of their medical condition is felt to place them at decreased risk for further clinical deterioration. Furthermore, it is anticipated that the patient will be medically  stable for discharge from the hospital within 2 midnights of admission.   Family Communication: Admission, patients condition and plan of care including tests being ordered have been discussed with the patient  who indicate understanding and agree with the plan and Code Status.  Consults called:   none  Review of Systems: All systems were reviewed and were negative except as mentioned in HPI above. Negative for fever Negative for chest pain Negative for shortness of breath  No past medical history on file.  No past surgical history on file.   reports that she has never smoked. She does not have any smokeless tobacco history on file. She reports that she does not drink alcohol and does not use drugs.  No Known Allergies  No family history on file.   Prior to Admission medications   Medication Sig Start Date End Date Taking? Authorizing Provider  albuterol (VENTOLIN HFA) 108 (90 Base) MCG/ACT inhaler Inhale 2 puffs into the lungs every 4 (four) hours as needed for wheezing or shortness of breath.   Yes [provider]  ibuprofen  (ADVIL ,MOTRIN ) 800 MG tablet Take 1 tablet (800 mg total) by mouth 3 (three) times daily. 10/04/14   Anthonette Marsh, PA-C  naproxen  (NAPROSYN ) 500 MG tablet Take 1 tablet (500 mg total) by mouth 2 (two) times daily. 08/21/15   Dowless, Lucie Bamberger, PA-C   r   Labs on Admission: I have personally reviewed following labs and imaging studies  CBC: Recent Labs  Lab 12/23/23 0131  WBC 8.9  HGB 6.7*  HCT 24.9*  MCV 67.1*  PLT 436*   Basic Metabolic Panel: Recent Labs  Lab 12/23/23 0131  NA 137  K 3.4*  CL 101  CO2 24  GLUCOSE 138*  BUN 12  CREATININE 0.81  CALCIUM 9.1   Estimated Creatinine Clearance: 160.9 mL/min (by C-G formula based on SCr of 0.81 mg/dL). Recent Labs  Lab 12/23/23 0136  AST 14*  ALT 9  ALKPHOS 82  BILITOT 0.2  PROT 7.6  ALBUMIN 3.8   No results for input(s): LIPASE, AMYLASE in the last 168 hours. No results for input(s): AMMONIA in the last 168 hours. Coagulation Profile: No results for input(s): INR, PROTIME in the last 168 hours. Cardiac Panel (last 3 results) No results for input(s): CKTOTAL, CKMB, TROPONINIHS, RELINDX in the last 72 hours.  BNP (last 3 results) Recent Labs     12/23/23 0136  PROBNP <50.0   HbA1C: No results for input(s): HGBA1C in the last 72 hours. CBG: No results for input(s): GLUCAP in the last 168 hours. Lipid Profile: No results for input(s): CHOL, HDL, LDLCALC, TRIG, CHOLHDL, LDLDIRECT in the last 72 hours. Thyroid Function Tests: No results for input(s): TSH, T4TOTAL, FREET4, T3FREE, THYROIDAB in the last 72 hours. Urine analysis:    Component Value Date/Time   COLORURINE YELLOW 12/23/2023 0720   APPEARANCEUR HAZY (A) 12/23/2023 0720   LABSPEC 1.020 12/23/2023 0720   PHURINE 6.0 12/23/2023 0720   GLUCOSEU NEGATIVE 12/23/2023 0720   HGBUR NEGATIVE 12/23/2023 0720   BILIRUBINUR NEGATIVE 12/23/2023 0720   KETONESUR NEGATIVE 12/23/2023 0720   PROTEINUR 30 (A) 12/23/2023 0720   NITRITE NEGATIVE 12/23/2023 0720   LEUKOCYTESUR NEGATIVE 12/23/2023 0720    Radiological Exams on Admission: DG Chest 2 View Result Date: 12/23/2023 EXAM: 2 VIEW(S) XRAY OF THE CHEST 12/23/2023 02:29:33 AM COMPARISON: None available. CLINICAL HISTORY: sob. SOB since having flu 1 month ago. Pt reports no relief from prescribed inhaler. Hx asthma. FINDINGS: LUNGS  AND PLEURA: No focal pulmonary opacity. No pulmonary edema. No pleural effusion. No pneumothorax. HEART AND MEDIASTINUM: No acute abnormality of the cardiac and mediastinal silhouettes. BONES AND SOFT TISSUES: No acute osseous abnormality. IMPRESSION: 1. Normal chest radiographs. Electronically signed by: Pinkie Pebbles MD 12/23/2023 02:38 AM EDT RP Workstation: [id number]   Mennie LAMY MD [facility]  If 7PM-7AM, please contact night-coverage [email or web address]  12/23/2023, 1:45 PM

## 2023-12-23 NOTE — ED Provider Notes (Signed)
 [place] [facility] Provider Note   CSN: [id number] Arrival date & time: 12/23/23  0111     Patient presents with: Asthma   Julie Kelley is a 43 y.o. female.   Presents to the emergency department for evaluation of shortness of breath.  Patient reports that she does have a history of asthma and thinks that her shortness of breath may be related to her asthma.  She also, however, has noticed that she has had progressively worsening swelling of her legs.  She reports that she has a very sedentary job.  She has had swelling in the past that she would take diuretics for but does not currently have any prescription.  She is not experiencing chest pain.       Prior to Admission medications   Medication Sig Start Date End Date Taking? Authorizing Provider  ibuprofen  (ADVIL ,MOTRIN ) 800 MG tablet Take 1 tablet (800 mg total) by mouth 3 (three) times daily. 10/04/14   Anthonette Marsh, PA-C  naproxen  (NAPROSYN ) 500 MG tablet Take 1 tablet (500 mg total) by mouth 2 (two) times daily. 08/21/15   Dowless, Samantha Tripp, PA-C    Allergies: Patient has no known allergies.    Review of Systems  Updated Vital Signs BP (!) 153/90 (BP Location: Right Arm)   Pulse (!) 108   Temp 98.1 F (36.7 C) (Oral)   Resp 20   Ht 5' 11 (1.803 m)   Wt (!) 181.4 kg   SpO2 99%   BMI 55.79 kg/m   Physical Exam Vitals and nursing note reviewed.  Constitutional:      General: She is not in acute distress.    Appearance: She is well-developed.  HENT:     Head: Normocephalic and atraumatic.     Mouth/Throat:     Mouth: Mucous membranes are moist.  Eyes:     General: Vision grossly intact. Gaze aligned appropriately.     Extraocular Movements: Extraocular movements intact.     Conjunctiva/sclera: Conjunctivae normal.  Cardiovascular:     Rate and Rhythm: Regular rhythm. Tachycardia present.     Pulses: Normal pulses.     Heart sounds: Normal heart sounds, S1 normal  and S2 normal. No murmur heard.    No friction rub. No gallop.  Pulmonary:     Effort: Pulmonary effort is normal. No respiratory distress.     Breath sounds: Normal breath sounds. Decreased air movement present.  Abdominal:     General: Bowel sounds are normal.     Palpations: Abdomen is soft.     Tenderness: There is no abdominal tenderness. There is no guarding or rebound.     Hernia: No hernia is present.  Musculoskeletal:        General: No swelling.     Cervical back: Full passive range of motion without pain, normal range of motion and neck supple. No spinous process tenderness or muscular tenderness. Normal range of motion.     Right lower leg: Edema present.     Left lower leg: Edema present.  Skin:    General: Skin is warm and dry.     Capillary Refill: Capillary refill takes less than 2 seconds.     Findings: No ecchymosis, erythema, rash or wound.  Neurological:     General: No focal deficit present.     Mental Status: She is alert and oriented to person, place, and time.     GCS: GCS eye subscore is 4. GCS verbal subscore  is 5. GCS motor subscore is 6.     Cranial Nerves: Cranial nerves 2-12 are intact.     Sensory: Sensation is intact.     Motor: Motor function is intact.     Coordination: Coordination is intact.  Psychiatric:        Attention and Perception: Attention normal.        Mood and Affect: Mood normal.        Speech: Speech normal.        Behavior: Behavior normal.     (all labs ordered are listed, but only abnormal results are displayed) Labs Reviewed  BASIC METABOLIC PANEL WITH GFR - Abnormal; Notable for the following components:      Result Value   Potassium 3.4 (*)    Glucose, Bld 138 (*)    All other components within normal limits  CBC - Abnormal; Notable for the following components:   RBC 3.71 (*)    Hemoglobin 6.7 (*)    HCT 24.9 (*)    MCV 67.1 (*)    MCH 18.1 (*)    MCHC 26.9 (*)    RDW 22.6 (*)    Platelets 436 (*)    All other  components within normal limits  HEPATIC FUNCTION PANEL - Abnormal; Notable for the following components:   AST 14 (*)    All other components within normal limits  PRO BRAIN NATRIURETIC PEPTIDE  PREGNANCY, URINE  URINALYSIS, ROUTINE W REFLEX MICROSCOPIC  TROPONIN T, HIGH SENSITIVITY    EKG: EKG Interpretation Date/Time:  Thursday December 23 2023 01:25:56 EDT Ventricular Rate:  103 PR Interval:  166 QRS Duration:  78 QT Interval:  343 QTC Calculation: 449 R Axis:   73  Text Interpretation: Sinus tachycardia Confirmed by Haze Lonni PARAS [phone]) on 12/23/2023 1:37:04 AM  Radiology: No results found.   Procedures   Medications Ordered in the ED - No data to display                                  Medical Decision Making Amount and/or Complexity of Data Reviewed External Data Reviewed: labs and ECG. Labs: ordered. Radiology: ordered.  Risk Decision regarding hospitalization.   Differential diagnosis considered includes, but not limited to: Asthma exacerbation; pneumonia; congestive heart failure; renal failure including nephrotic syndrome  Presents to the emergency department for evaluation of shortness of breath.  Patient reports a history of asthma.  She has diminished breath sounds on exam but no active wheezing.  She is not hypoxic.  Patient noted to be somewhat tachycardic.  EKG does not show any ischemia.  Troponin negative.  BNP is normal.  Lab work does not reveal renal failure hypoproteinemia or hypoalbuminemia.  CBC shows anemia, hemoglobin is 6.7.  Reviewing records reveals her baseline is somewhere between 8.5 and 11.5.  She reports that she has very heavy menstrual periods, recently had her period.  No longer bleeding.  Patient experiencing symptomatic anemia from her heavy menstruation, would benefit from transfusion secondary to her symptomatology.  Cannot transfuse blood at freestanding ER, will need to admit.     Final diagnoses:   Symptomatic anemia  Peripheral edema    ED Discharge Orders     None          Haze Lonni PARAS, MD 12/23/23 [phone]

## 2023-12-23 NOTE — ED Notes (Signed)
 Pt sleeping on her back dropping pulse ox to 89 % RA placed on 2 l [place] pt [id number]

## 2023-12-23 NOTE — Plan of Care (Incomplete)

## 2023-12-23 NOTE — ED Notes (Signed)
Report to [facility].

## 2023-12-23 NOTE — Progress Notes (Signed)
 Consent for blood signed and on the patient's chart.

## 2023-12-24 ENCOUNTER — Observation Stay (HOSPITAL_COMMUNITY): Payer: Self-pay

## 2023-12-24 ENCOUNTER — Other Ambulatory Visit (HOSPITAL_COMMUNITY): Payer: Self-pay

## 2023-12-24 DIAGNOSIS — R600 Localized edema: Secondary | ICD-10-CM

## 2023-12-24 LAB — BPAM RBC
Blood Product Expiration Date: 202511182359
Blood Product Expiration Date: 202511182359
ISSUE DATE / TIME: 202510161432
ISSUE DATE / TIME: 202510162038
Unit Type and Rh: 7300
Unit Type and Rh: 7300

## 2023-12-24 LAB — CBC
HCT: 31.9 % — ABNORMAL LOW (ref 36.0–46.0)
Hemoglobin: 8.7 g/dL — ABNORMAL LOW (ref 12.0–15.0)
MCH: 20 pg — ABNORMAL LOW (ref 26.0–34.0)
MCHC: 27.3 g/dL — ABNORMAL LOW (ref 30.0–36.0)
MCV: 73.2 fL — ABNORMAL LOW (ref 80.0–100.0)
Platelets: 443 K/uL — ABNORMAL HIGH (ref 150–400)
RBC: 4.36 MIL/uL (ref 3.87–5.11)
RDW: 24.8 % — ABNORMAL HIGH (ref 11.5–15.5)
WBC: 8.1 K/uL (ref 4.0–10.5)
nRBC: 0 % (ref 0.0–0.2)

## 2023-12-24 LAB — ECHOCARDIOGRAM COMPLETE
AR max vel: 2.19 cm2
AV Area VTI: 2.14 cm2
AV Area mean vel: 2.36 cm2
AV Mean grad: 7 mmHg
AV Peak grad: 11.7 mmHg
Ao pk vel: 1.71 m/s
Area-P 1/2: 4.8 cm2
Height: 71 in
S' Lateral: 2.6 cm
Weight: 6400 [oz_av]

## 2023-12-24 LAB — TYPE AND SCREEN
ABO/RH(D): B POS
Antibody Screen: NEGATIVE
Unit division: 0
Unit division: 0

## 2023-12-24 LAB — BASIC METABOLIC PANEL WITH GFR
Anion gap: 11 (ref 5–15)
BUN: 12 mg/dL (ref 6–20)
CO2: 24 mmol/L (ref 22–32)
Calcium: 9.4 mg/dL (ref 8.9–10.3)
Chloride: 103 mmol/L (ref 98–111)
Creatinine, Ser: 0.76 mg/dL (ref 0.44–1.00)
GFR, Estimated: >60 mL/min (ref >60)
Glucose, Bld: 125 mg/dL — ABNORMAL HIGH (ref 70–99)
Potassium: 4.1 mmol/L (ref 3.5–5.1)
Sodium: 138 mmol/L (ref 135–145)

## 2023-12-24 LAB — HIV ANTIBODY (ROUTINE TESTING W REFLEX): HIV Screen 4th Generation wRfx: NONREACTIVE

## 2023-12-24 MED ORDER — POLYSACCHARIDE IRON COMPLEX 150 MG PO CAPS
150.0000 mg | ORAL_CAPSULE | Freq: Every day | ORAL | 2 refills | Status: AC
  Filled 2023-12-24: qty 30, 30d supply, fill #0

## 2023-12-24 NOTE — Discharge Summary (Signed)
 Physician Discharge Summary  Julie Kelley FMW:[id number] DOB: 1980-12-25 DOA: 12/23/2023  PCP: Glendia Jacob, NP  Admit date: 12/23/2023 Discharge date: 12/24/2023 Recommendations for Outpatient Follow-up:  Follow up with PCP in 1 weeks-call for appointment Please obtain BMP/CBC in one week  Discharge Dispo: Home Discharge Condition: Stable Code Status:   Code Status: Full Code Diet recommendation:  Diet Order             Diet general           Diet regular Room service appropriate? Yes; Fluid consistency: Thin  Diet effective now                    Brief/Interim Summary: Julie Kelley is a 43 y.o. female with PMH of Severe morbid obesity, asthma, menorrhagia, who presents to the ER with complaints of shortness of breath and bilateral lower extremity edema. She has been having issues with heavy period for a while, was told she has fibroid and had ablation done in march without improvement. She gets prolonged period about 9 days with heavy menstruation including clots, and she just completed her cycle. Patient otherwise denies any nausea, vomiting,fever, chills, headache, focal weakness, numbness tingling, speech difficulties.  In the ER, BNP less than 50.  Hemoglobin 6.7 from baseline of 8.5-11.5.  Tachycardic.  The patient admits to having very heavy menstruals.  Admitted to receive blood transfusion in the setting of symptomatic anemia  Pregnancy test negative UA unremarkable labs with microcytic anemia thrombocytopenia. Currently on transfer [facility] room 1501,afebrile, VSS. Patient is admitted given 2 units of PRBC transfusion with appropriate increase in hemoglobin to 8.7 g. B12 folate normal, does have iron deficiency anemia will replace with oral iron supplementation BID and will need close follow-up w/ PCP   Subjective: Overnight afebrile BP stable hemoglobin improved improved Feels much improved following transfusion.  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 B12 folate normal, does have iron deficiency anemia will replace with oral iron supplementation BID and will need close follow-up w/ PCP  She will need follow-up with GYN to address this issue currently no evidence of bleeding.  She is aware about the recommendation  Non pitting lower leg edema: Likely lymphedema.BNP normal but obese.  Tte - unremarkable.   Hypokalemia: Replaced  History of asthma: Stable.  Morbid Obesity w/ Body mass index is 55.79 kg/m.: Will benefit with PCP follow-up, weight loss,healthy lifestyle and outpatient sleep eval  PT Follow up Rec:    DVT prophylaxis: SCDs Start: 12/23/23 1209 Code Status:   Code Status: Full Code Family Communication: plan of care discussed with patient at bedside. Patient status is: Remains hospitalized because of severity of illness Level of care: Telemetry   Dispo: The patient is from: home            Anticipated disposition: Home today Objective: Vitals last 24 hrs: Vitals:   12/23/23 2359 12/24/23 0208 12/24/23 0523 12/24/23 1242  BP: (!) 135/96 (!) 141/72 (!) 143/57 (!) 140/94  Pulse: 93 (!) 101 99 82  Resp: 16 17 17 18   Temp: 98.7 F (37.1 C) (!) 97.3 F (36.3 C) 98.5 F (36.9 C) 98.6 F (37 C)  TempSrc: Oral Oral Oral Oral  SpO2: 100% 99% 99% 100%  Weight:      Height:        Physical Examination: General exam: alert awake,  oriented, older than stated age HEENT:Oral mucosa moist, Ear/Nose WNL grossly Respiratory system: Bilaterally clear BS,no use of accessory muscle Cardiovascular system: S1 & S2 +, No JVD. Gastrointestinal system: Abdomen soft,NT,ND, BS+ Nervous System: Alert, awake, moving all extremities,and following commands. Extremities: extremities warm, leg edema nonpitting Skin: Warm, no rashes MSK: Normal muscle bulk,tone, power   Medications  reviewed:  Scheduled Meds:  furosemide  40 mg Intravenous Once   Continuous Infusions: Diet: Diet Order             Diet general           Diet regular Room service appropriate? Yes; Fluid consistency: Thin  Diet effective now                  DVT prophylaxis: SCDs Start: 12/23/23 1209 Code Status:   Code Status: Full Code Family Communication: plan of care discussed with patient at bedside Patient status is: Remains hospitalized because of severity of illness Level of care: Telemetry   Dispo: The patient is from: home            Anticipated disposition: TBD Objective: Vitals last 24 hrs: Vitals:   12/23/23 2359 12/24/23 0208 12/24/23 0523 12/24/23 1242  BP: (!) 135/96 (!) 141/72 (!) 143/57 (!) 140/94  Pulse: 93 (!) 101 99 82  Resp: 16 17 17 18   Temp: 98.7 F (37.1 C) (!) 97.3 F (36.3 C) 98.5 F (36.9 C) 98.6 F (37 C)  TempSrc: Oral Oral Oral Oral  SpO2: 100% 99% 99% 100%  Weight:      Height:        Physical Examination: General exam: alert awake, oriented, older than stated age HEENT:Oral mucosa moist, Ear/Nose WNL grossly Respiratory system: Bilaterally clear BS,no use of accessory muscle Cardiovascular system: S1 & S2 +, No JVD. Gastrointestinal system: Abdomen soft,NT,ND, BS+ Nervous System: Alert, awake, moving all extremities,and following commands. Extremities: extremities warm, leg edemanon pitting Skin: Warm, no rashes MSK: Normal muscle bulk,tone, power   Medications reviewed:  Scheduled Meds:  furosemide  40 mg Intravenous Once   Continuous Infusions: Diet: Diet Order             Diet general           Diet regular Room service appropriate? Yes; Fluid consistency: Thin  Diet effective now                     Discharge Instructions  Discharge Instructions     Diet general   Complete by: As directed    Discharge instructions   Complete by: As directed    Please call call MD or return to ER for similar or worsening  recurring problem that brought you to hospital or if any fever,nausea/vomiting,abdominal pain, uncontrolled pain, chest pain,  shortness of breath or any other alarming symptoms.  Please follow-up your doctor as instructed in a week time and call the office for appointment.  Please avoid alcohol, smoking, or any other illicit substance and maintain healthy habits including taking your regular medications as prescribed.  You were cared for by a hospitalist during your hospital stay. If you have any questions about your discharge medications or the care you received while you were in the hospital after you are discharged, you can call the unit and ask to speak with the hospitalist on call if the hospitalist that took care of you is not available.  Once you are discharged, your primary care physician will handle any further  medical issues. Please note that NO REFILLS for any discharge medications will be authorized once you are discharged, as it is imperative that you return to your primary care physician (or establish a relationship with a primary care physician if you do not have one) for your aftercare needs so that they can reassess your need for medications and monitor your lab values   Increase activity slowly   Complete by: As directed       Allergies as of 12/24/2023   No Known Allergies      Medication List     STOP taking these medications    ibuprofen  800 MG tablet Commonly known as: ADVIL        TAKE these medications    albuterol 108 (90 Base) MCG/ACT inhaler Commonly known as: VENTOLIN HFA Inhale 2 puffs into the lungs every 4 (four) hours as needed for wheezing or shortness of breath.   calcium carbonate 500 MG chewable tablet Commonly known as: TUMS - dosed in mg elemental calcium Chew 1 tablet by mouth as needed for indigestion or heartburn.   Ferrex 150 150 MG capsule Generic drug: iron polysaccharides Take 1 capsule (150 mg total) by mouth daily.         No Known Allergies  The results of significant diagnostics from this hospitalization (including imaging, microbiology, ancillary and laboratory) are listed below for reference.    Microbiology: No results found for this or any previous visit (from the past 240 hours).  Procedures/Studies: ECHOCARDIOGRAM COMPLETE Result Date: 12/24/2023    ECHOCARDIOGRAM REPORT   Patient Name:   Julie Kelley Date of Exam: 12/24/2023 Medical Rec #:  [id number]       Height:       71.0 in Accession #:    [id number]      Weight:       400.0 lb Date of Birth:  08-27-1980       BSA:          2.831 m Patient Age:    43 years        BP:           143/57 mmHg Patient Gender: F               HR:           90 bpm. Exam Location:  Inpatient Procedure: 2D Echo, Cardiac Doppler and Color Doppler (Both Spectral and Color            Flow Doppler were utilized during procedure). Indications:    Congestive Heart Failure I50.9  History:        Patient has no prior history of Echocardiogram examinations.  Sonographer:    Jayson Gaskins Referring Phys: [id number] Krithika Tome IMPRESSIONS  1. Left ventricular ejection fraction, by estimation, is 65 to 70%. The left ventricle has normal function. The left ventricle has no regional wall motion abnormalities. Left ventricular diastolic parameters were normal.  2. Right ventricular systolic function is normal. The right ventricular size is not well visualized. Tricuspid regurgitation signal is inadequate for assessing PA pressure.  3. Left atrial size was mildly dilated.  4. Right atrial size was mildly dilated.  5. The mitral valve is normal in structure. Trivial mitral valve regurgitation. No evidence of mitral stenosis.  6. The aortic valve is tricuspid. Aortic valve regurgitation is not visualized. No aortic stenosis is present. Comparison(s): No prior Echocardiogram. Conclusion(s)/Recommendation(s): Otherwise normal echocardiogram, with minor abnormalities described in the report. FINDINGS   Left Ventricle: Left  ventricular ejection fraction, by estimation, is 65 to 70%. The left ventricle has normal function. The left ventricle has no regional wall motion abnormalities. The left ventricular internal cavity size was normal in size. There is  no left ventricular hypertrophy. Left ventricular diastolic parameters were normal. Right Ventricle: The right ventricular size is not well visualized. Right vetricular wall thickness was not well visualized. Right ventricular systolic function is normal. Tricuspid regurgitation signal is inadequate for assessing PA pressure. Left Atrium: Left atrial size was mildly dilated. Right Atrium: Right atrial size was mildly dilated. Pericardium: There is no evidence of pericardial effusion. Mitral Valve: The mitral valve is normal in structure. Trivial mitral valve regurgitation. No evidence of mitral valve stenosis. Tricuspid Valve: The tricuspid valve is normal in structure. Tricuspid valve regurgitation is not demonstrated. No evidence of tricuspid stenosis. Aortic Valve: The aortic valve is tricuspid. Aortic valve regurgitation is not visualized. No aortic stenosis is present. Aortic valve mean gradient measures 7.0 mmHg. Aortic valve peak gradient measures 11.7 mmHg. Aortic valve area, by VTI measures 2.14  cm. Pulmonic Valve: The pulmonic valve was normal in structure. Pulmonic valve regurgitation is not visualized. No evidence of pulmonic stenosis. Aorta: The aortic root is normal in size and structure. Venous: The inferior vena cava was not well visualized. IAS/Shunts: The interatrial septum was not well visualized.  LEFT VENTRICLE PLAX 2D LVIDd:         4.80 cm   Diastology LVIDs:         2.60 cm   LV e' medial:    10.70 cm/s LV PW:         1.10 cm   LV E/e' medial:  10.8 LV IVS:        0.90 cm   LV e' lateral:   12.60 cm/s LVOT diam:     2.00 cm   LV E/e' lateral: 9.2 LV SV:         81 LV SV Index:   29 LVOT Area:     3.14 cm  RIGHT VENTRICLE RV S prime:      12.30 cm/s TAPSE (M-mode): 3.2 cm LEFT ATRIUM             Index        RIGHT ATRIUM           Index LA Vol (A2C):   53.9 ml 19.04 ml/m  RA Area:     21.30 cm LA Vol (A4C):   83.1 ml 29.35 ml/m  RA Volume:   70.40 ml  24.87 ml/m LA Biplane Vol: 69.5 ml 24.55 ml/m  AORTIC VALVE AV Area (Vmax):    2.19 cm AV Area (Vmean):   2.36 cm AV Area (VTI):     2.14 cm AV Vmax:           171.00 cm/s AV Vmean:          131.000 cm/s AV VTI:            0.381 m AV Peak Grad:      11.7 mmHg AV Mean Grad:      7.0 mmHg LVOT Vmax:         119.00 cm/s LVOT Vmean:        98.400 cm/s LVOT VTI:          0.259 m LVOT/AV VTI ratio: 0.68  AORTA Ao Root diam: 3.00 cm MITRAL VALVE MV Area (PHT): 4.80 cm     SHUNTS MV Decel Time: 158 msec  Systemic VTI:  0.26 m MV E velocity: 116.00 cm/s  Systemic Diam: 2.00 cm MV A velocity: 91.30 cm/s MV E/A ratio:  1.27 Emeline Calender Electronically signed by Emeline Calender Signature Date/Time: 12/24/2023/4:09:26 PM    Final    DG Chest 2 View Result Date: 12/23/2023 EXAM: 2 VIEW(S) XRAY OF THE CHEST 12/23/2023 02:29:33 AM COMPARISON: None available. CLINICAL HISTORY: sob. SOB since having flu 1 month ago. Pt reports no relief from prescribed inhaler. Hx asthma. FINDINGS: LUNGS AND PLEURA: No focal pulmonary opacity. No pulmonary edema. No pleural effusion. No pneumothorax. HEART AND MEDIASTINUM: No acute abnormality of the cardiac and mediastinal silhouettes. BONES AND SOFT TISSUES: No acute osseous abnormality. IMPRESSION: 1. Normal chest radiographs. Electronically signed by: Pinkie Pebbles MD 12/23/2023 02:38 AM EDT RP Workstation: [id number]    Labs: BNP (last 3 results) No results for input(s): BNP in the last 8760 hours. Basic Metabolic Panel: Recent Labs  Lab 12/23/23 0131 12/24/23 0514  NA 137 138  K 3.4* 4.1  CL 101 103  CO2 24 24  GLUCOSE 138* 125*  BUN 12 12  CREATININE 0.81 0.76  CALCIUM 9.1 9.4   Liver Function Tests: Recent Labs  Lab 12/23/23 0136  AST 14*   ALT 9  ALKPHOS 82  BILITOT 0.2  PROT 7.6  ALBUMIN 3.8   No results for input(s): LIPASE, AMYLASE in the last 168 hours. No results for input(s): AMMONIA in the last 168 hours. CBC: Recent Labs  Lab 12/23/23 0131 12/24/23 0514  WBC 8.9 8.1  HGB 6.7* 8.7*  HCT 24.9* 31.9*  MCV 67.1* 73.2*  PLT 436* 443*   CBG: No results for input(s): GLUCAP in the last 168 hours. Hgb A1c No results for input(s): HGBA1C in the last 72 hours. Anemia work up Recent Labs    12/23/23 1230 12/23/23 1233  VITAMINB12  --  766  FOLATE  --  7.0  FERRITIN  --  8*  TIBC  --  437  IRON  --  16*  RETICCTPCT 1.9  --   Cardiac Enzymes: No results for input(s): CKTOTAL, CKMB, CKMBINDEX, TROPONINI in the last 168 hours. BNP: Invalid input(s): POCBNP D-Dimer No results for input(s): DDIMER in the last 72 hours. Lipid Profile No results for input(s): CHOL, HDL, LDLCALC, TRIG, CHOLHDL, LDLDIRECT in the last 72 hours. Thyroid function studies No results for input(s): TSH, T4TOTAL, T3FREE, THYROIDAB in the last 72 hours.  Invalid input(s): FREET3 Urinalysis    Component Value Date/Time   COLORURINE YELLOW 12/23/2023 0720   APPEARANCEUR HAZY (A) 12/23/2023 0720   LABSPEC 1.020 12/23/2023 0720   PHURINE 6.0 12/23/2023 0720   GLUCOSEU NEGATIVE 12/23/2023 0720   HGBUR NEGATIVE 12/23/2023 0720   BILIRUBINUR NEGATIVE 12/23/2023 0720   KETONESUR NEGATIVE 12/23/2023 0720   PROTEINUR 30 (A) 12/23/2023 0720   NITRITE NEGATIVE 12/23/2023 0720   LEUKOCYTESUR NEGATIVE 12/23/2023 0720   Sepsis Labs Recent Labs  Lab 12/23/23 0131 12/24/23 0514  WBC 8.9 8.1   Microbiology No results found for this or any previous visit (from the past 240 hours).   Time coordinating discharge: 25  minutes  SIGNED: Mennie LAMY, MD  [facility] 12/24/2023, 4:50 PM  If 7PM-7AM, please contact night-coverage [email or web address]

## 2023-12-24 NOTE — Progress Notes (Signed)
 Discharge medication delivered to patient at the [facility]

## 2023-12-24 NOTE — Plan of Care (Signed)

## 2023-12-24 NOTE — Plan of Care (Signed)
  Problem: Education: Goal: Knowledge of General Education information will improve Description: Including pain rating scale, medication(s)/side effects and non-pharmacologic comfort measures Outcome: Progressing   Problem: Clinical Measurements: Goal: Will remain free from infection Outcome: Progressing Goal: Respiratory complications will improve Outcome: Progressing Goal: Cardiovascular complication will be avoided Outcome: Progressing   Problem: Activity: Goal: Risk for activity intolerance will decrease Outcome: Progressing   Problem: Coping: Goal: Level of anxiety will decrease Outcome: Progressing   Problem: Elimination: Goal: Will not experience complications related to urinary retention Outcome: Progressing   Problem: Safety: Goal: Ability to remain free from injury will improve Outcome: Progressing
